# Patient Record
Sex: Female | Born: 1965 | Race: White | Hispanic: No | Marital: Married
Health system: Midwestern US, Academic
[De-identification: ages and names within clinical notes are randomized; demographics above are authoritative.]

---

## 2021-09-19 ENCOUNTER — Encounter: Admit: 2021-09-19 | Discharge: 2021-09-19 | Payer: Private Health Insurance - Indemnity | Primary: Primary Care

## 2021-09-26 ENCOUNTER — Encounter: Admit: 2021-09-26 | Discharge: 2021-09-26 | Payer: Private Health Insurance - Indemnity | Primary: Primary Care

## 2021-09-26 DIAGNOSIS — C50911 Malignant neoplasm of unspecified site of right female breast: Secondary | ICD-10-CM

## 2021-09-26 DIAGNOSIS — R69 Illness, unspecified: Secondary | ICD-10-CM

## 2021-09-26 NOTE — Progress Notes
H&E Slides of [facility] # [id number], Path Date 09/05/2021, requested on 09/26/2021 from [facility].  *.     No shipping label faxed per request of outside facility.  Path slides being sent via courier.

## 2021-09-27 ENCOUNTER — Encounter: Admit: 2021-09-27 | Discharge: 2021-09-27 | Payer: Private Health Insurance - Indemnity | Primary: Primary Care

## 2021-09-27 DIAGNOSIS — C50911 Malignant neoplasm of unspecified site of right female breast: Secondary | ICD-10-CM

## 2021-09-27 DIAGNOSIS — I10 Essential (primary) hypertension: Secondary | ICD-10-CM

## 2021-09-27 DIAGNOSIS — C50511 Malignant neoplasm of lower-outer quadrant of right female breast: Secondary | ICD-10-CM

## 2021-09-27 DIAGNOSIS — C801 Malignant (primary) neoplasm, unspecified: Secondary | ICD-10-CM

## 2021-09-27 LAB — CBC AND DIFF
ABSOLUTE BASO COUNT: 0 K/UL (ref 0–0.20)
ABSOLUTE MONO COUNT: 0.5 K/UL (ref >60)
HEMOGLOBIN: 12 g/dL (ref 12.0–15.0)
LYMPHOCYTES %: 24 % (ref 24–44)
MPV: 9.5 FL — ABNORMAL HIGH (ref 7–11)
RBC COUNT: 4 M/UL (ref 4.0–5.0)
WBC COUNT: 4 K/UL — ABNORMAL LOW (ref 4.5–11.0)

## 2021-09-27 LAB — IRON + BINDING CAPACITY + %SAT+ FERRITIN: IRON: 44 ug/dL — ABNORMAL LOW (ref 50–160)

## 2021-09-27 LAB — VITAMIN B12: VITAMIN B12: 745 pg/mL (ref 180–914)

## 2021-09-27 LAB — COMPREHENSIVE METABOLIC PANEL
ALK PHOSPHATASE: 104 U/L (ref 25–110)
ALT: 22 U/L (ref 7–56)
ANION GAP: 10 K/UL (ref 3–12)
AST: 22 U/L (ref 7–40)
BLD UREA NITROGEN: 11 mg/dL (ref 7–25)
CALCIUM: 9.8 mg/dL (ref 8.5–10.6)
CO2: 26 MMOL/L (ref 21–30)
CREATININE: 0.8 mg/dL (ref 0.4–1.00)
TOTAL BILIRUBIN: 0.8 mg/dL (ref 0.3–1.2)

## 2021-09-27 LAB — FOLATE, SERUM: SERUM FOLATE: >22 ng/mL — ABNORMAL LOW (ref >3.9)

## 2021-09-27 MED ORDER — BENZONATATE 100 MG PO CAP
100 mg | ORAL_CAPSULE | ORAL | 0 refills | 9.00000 days | Status: AC
Start: 2021-09-27 — End: ?

## 2021-09-27 MED ORDER — AZITHROMYCIN 250 MG PO TAB
ORAL_TABLET | Freq: Every day | ORAL | 0 refills | Status: AC
Start: 2021-09-27 — End: ?

## 2021-09-27 NOTE — Progress Notes
Name: Madeline Johnston          MRN: [id number]      DOB: 02-08-1966      AGE: 55 y.o.   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 female.     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    She recently palpated a large right breast lump at the right breast 6 to 7 o'clock position and her primary care provider Terri Piedra, APRN immediately arranged for diagnostic bilateral mammograms and right breast ultrasound.  The patient had not undergone mammographic screening for greater than 10 years.    08/31/2021 diagnostic mammograms and right breast ultrasound at [facility] in [facility] revealed a dominant hyperdense macrolobulated mass at the posterior inferior right breast 6 o'clock position along the chest wall with minimal asymmetry of a right upper outer quadrant intramammary node and abnormal axillary node findings.    09/05/2021 right breast and right axillary biopsies performed at [facility] reveal invasive poorly differentiated ductal carcinoma in the right breast needle biopsy and extensive involvement by poorly differentiated ductal carcinoma in the right axillary lymph node needle biopsy.  Pathology shows that the tumors are grade 3 with ER 0% PR 0%, HER2/neu 0 by IHC with Ki-67 proliferation index of 98%.    09/18/2021 she underwent CT chest abdomen pelvis and bone scan at [facility] with no evidence of metastatic disease.  The CT scan does show the right breast masses present up to 3.6 cm in size with multiple smaller probable satellite nodules and numerous enlarged right axillary nodes and incidental finding of subcentimeter sclerotic focus in the right glenoid likely representing a bone island.  There is no activity on the bone scan and no evidence of metastatic disease.    09/19/2021 bilateral breast MRI performed at [facility] shows the right breast known cancer in the 6 to 7 o'clock position measuring up to 3.7 cm with numerous adjacent satellite masses and extensive non-mass enhancement at the 6 to 7 o'clock position extending toward the nipple with the largest mass inclusive measurement of the non-mass enhancement of 9.2 cm in AP dimension.  The enhancement contacts the chest wall but demonstrates no definitive invasion.  She has atypically enlarged right axillary lymph nodes multiple concerning for metastases.  The left breast has a small amount of benign background enhancement and a T2 hyperintense intramammary lymph node in the anterior aspect of the left breast and left axillary lymph nodes appear minimally enlarged by MRI.  Consider ultrasound of left axilla.    She met Dr. Cyndie Chime in surgery at [facility] 2 weeks ago and did meet with Dr. Donnajean Lopes at  [facility] yesterday who discussed neoadjuvant chemotherapy and immunotherapy.    Past medical history: Hypertension since age 47.  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  Had negative cardiac catheterization.  Appendectomy at age 48.  Hysterectomy 30 years ago due to fibroids.  She did have a COVID infection in July 2022 and has been fatigued since that time.    Gynecology history: Hysterectomy 30 years ago due to fibroids.  Age of first menstrual cycle 10.  Age of first live birth 42.  G3 P3.  She did not breast-feed her children.  She does have retained ovaries.  She has a 8 year old daughter and a 11 year old daughter and a 30 year old son.    Social history: She is accompanied by her husband Michelle Piper.  They have been married for 4 years and live in [place].  She works and an Clinical research associate or optometrist office in [place] but is not currently working because of her breast cancer diagnosis.  She drinks approximately 2 alcoholic beverages per week.    Family history: Mother with renal cell carcinoma at age 17.  Maternal aunt with uterine cancer at age 35.  Maternal grandmother with ovarian cancer at age 21.  She did undergo genetic testing lab draw yesterday at Dr. Zane Herald T's office.       Review of Systems  Mild fatigue since July 2022 but overall doing well.  She does have a new cough in the last week and denies any fever.  The cough is very bothersome.  She will COVID test her self and we are calling in an antibiotic and a cough suppressant.  She notes the right breast mass which developed over the last 2 months.    Constitutional: Negative for fever and chills.   HENT: Negative for ear pain, sore throat, mouth sores, trouble swallowing, neck pain and sinus pressure.    Eyes: Negative for visual disturbance.   Respiratory: Negative for shortness of breath.    Cardiovascular: Negative for chest pain.   Gastrointestinal: Negative for nausea, diarrhea, constipation and blood in stool.   Genitourinary: Negative for dysuria, urgency, frequency, difficulty urinating and dyspareunia.   Musculoskeletal: Negative.    Neurological: Negative for weakness, numbness and headaches.   Hematological: Does not bruise/bleed easily.   Psychiatric/Behavioral: Negative for confusion and decreased concentration. The patient is not nervous/anxious.        Objective:         ? amLODIPine (NORVASC) 10 mg tablet Take 10 mg by mouth.   ? ascorbic acid (VITAMIN C PO) Take  by mouth.   ? aspirin 81 mg cap Take 81 mg by mouth.   ? azithromycin (ZITHROMAX Z-PAK) 250 mg tablet Take two tablets by mouth daily for 1 day, THEN one tablet daily for 4 days.   ? benzonatate (TESSALON PERLES) 100 mg capsule Take one capsule by mouth every 8 hours.   ? carvediloL (COREG) 3.125 mg tablet Take 3.125 mg by mouth.   ? furosemide (LASIX) 20 mg tablet    ? losartan-hydroCHLOROthiazide (HYZAAR) 100-25 mg tablet    ? potassium chloride (K-TAB) 20 mEq tablet   60 EA, TAKE ONE TABLET BY MOUTH TWICE DAILY, 0 Number of Refills     Vitals:    09/27/21 1313   BP: (!) 166/100   BP Source: Arm, Left Upper   Pulse: 77   Temp: 36.7 ?C (98 ?F)   Resp: 20   SpO2: 98%   TempSrc: Oral   PainSc: Zero   Weight:  87.5 kg (193 lb)   Height: 157 cm (5' 1.81)     Body mass index is 35.52 kg/m?Marland Kitchen     Pain Score: Zero       Fatigue Scale: 3    Pain Addressed:  N/A    Patient Evaluated for a Clinical Trial: Patient currently in screening for a treatment clinical trial.     Eastern Cooperative Oncology Group performance status is 0, Fully active, able to carry on all pre-disease performance without restriction.Marland Kitchen     Physical Exam     Very pleasant engaging Caucasian female in no distress.  She does have an occasional cough.  HEENT: No scleral icterus.  She is wearing facemask.  Cardiac: Regular without murmur.  Pulmonary: Clear throughout.  Breasts: Right breast pendulous with very large hard 6 cm mass at the 6 o'clock position.  The lower portion of this is close to the skin and harder with skin thickening.  No true peau d'orange and no erythema or skin color change.  She does have an enlarged 2 cm high right axillary node which is mobile.  Left breast pendulous without mass or adenopathy.  Extremities: No abnormalities.  Neuro: Alert and oriented x3.     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  09/05/2021 right breast and right axillary biopsy reveals poorly differentiated invasive ductal carcinoma which is triple negative with high Ki-67 of 90%.  Further imaging reveals multiple abnormally enlarged right axillary nodes.  09/18/2021 CT chest abdomen pelvis and bone scan show no evidence of metastatic disease.  She understands that these scan results are encouraging but with her high-grade triple negative breast cancer and multiple node positivity, she is at very high risk for micrometastatic disease.  I reviewed all of the outside imaging reports and outside pathology.    I discussed indications for neoadjuvant chemo immunotherapy in patients with triple negative breast cancer.  She will undergo lab work today and is already scheduled for echocardiogram and port placement at [facility] tomorrow.  I discussed neoadjuvant chemo immunotherapy as per the Keynote 522 trial with Taxol carbo pembrolizumab followed by Adriamycin Cytoxan and pembrolizumab to be followed by surgery then adjuvant immunotherapy plus minus additional chemotherapy depending upon response to neoadjuvant treatment.  I am evaluating for our NeoTRACT clinical trial.    She understands that she will require radiotherapy once recovered from surgery due to her node positive disease.    We will arrange for chemotherapy teaching appointment early next week and we will plan to initiate chemo immunotherapy at that time.    I will arrange for [place] breast surgical oncology evaluation soon and will discuss at that time whether or not she would benefit from left axillary ultrasound.    Due to triple negative breast carcinoma at age less than 89 and her positive family history, she did undergo genetic testing at [facility] on 09/26/2021 and we will call for these results when they are available.  This could affect surgical decision and ongoing surveillance following completion of her chemoimmunotherapy.    Approximately 1 hour was spent with the patient and her husband and all their questions were answered to their satisfaction.  Greater than 50% the time was spent in counsel  regarding complex medical issues.  An additional 45 minutes was spent in review of records, documentation, and coordination of care.    This note is partially generated using voice recognition software.  Please excuse any typographical errors.

## 2021-10-02 ENCOUNTER — Encounter: Admit: 2021-10-02 | Discharge: 2021-10-02 | Payer: Private Health Insurance - Indemnity | Primary: Primary Care

## 2021-10-05 ENCOUNTER — Encounter: Admit: 2021-10-05 | Discharge: 2021-10-05 | Payer: Private Health Insurance - Indemnity | Primary: Primary Care

## 2021-10-05 NOTE — Progress Notes
Case Management Progress Note    Name: Madeline Johnston           MRN: [id number]                 DOB: 1966/09/16          Age: 56 y.o.  Date of Service: 10/05/2021          Care Planning   Living Arrangements  Living Arrangements  Type of Residence: Home, independent     Transportation  Transportation  Does the patient use Medicaid Transportation?: No     Coverage   Case Management Additional Coverage  Primary Insurance: Geophysicist/field seismologist Insurance: No insurance  Additional Coverage: RX    Screening/Assessment   Level of Function   Level of Function  Curent Level of Function: Mining engineer Abilities: Alert and Oriented     Source of Income  Source of Income  Source Of Income: Unemployed     DME        Home Health        Hemodialysis or Peritoneal Dialysis        Tube/Enteral        Infusion        Private Duty        HCBS        Ryan White  Ryan White   Ryan White: N/A     Hospice        Outpatient Therapy        SNF/NH        IPR        [place] History: No      Plan  New patient assessment-not an in person visit.    Interventions  Review of patient information, Dx with breast cancer. Patient is married, lives in [place], [place], [place], no known support system issues. Patient is currently not employed. SW provided information via mail (patient should receive soon) on breast cancer support & financial resources, SW role, services & contact information. Will assist as needed.   Support  Support: Patient Education     Info or Referral  Information or Referral to [employer] Resources: Estate manager/land agent Resources     Care Planning        Medication Needs        Financial        Legal       Disposition

## 2021-10-09 ENCOUNTER — Encounter: Admit: 2021-10-09 | Discharge: 2021-10-09 | Payer: Private Health Insurance - Indemnity | Primary: Primary Care

## 2021-10-09 DIAGNOSIS — C50911 Malignant neoplasm of unspecified site of right female breast: Secondary | ICD-10-CM

## 2022-02-15 ENCOUNTER — Encounter: Admit: 2022-02-15 | Discharge: 2022-02-15 | Payer: Private Health Insurance - Indemnity | Primary: Primary Care

## 2022-03-06 ENCOUNTER — Encounter: Admit: 2022-03-06 | Discharge: 2022-03-06 | Payer: Private Health Insurance - Indemnity | Primary: Primary Care

## 2023-01-20 IMAGING — MG POST PROCEDURE MAMMOGRAM
1 series · 3 of 3 positions shown · non-contrast
Comparison: none

[Series 2: R CC · right · 3 of 3 slices shown]
[im 1/3]
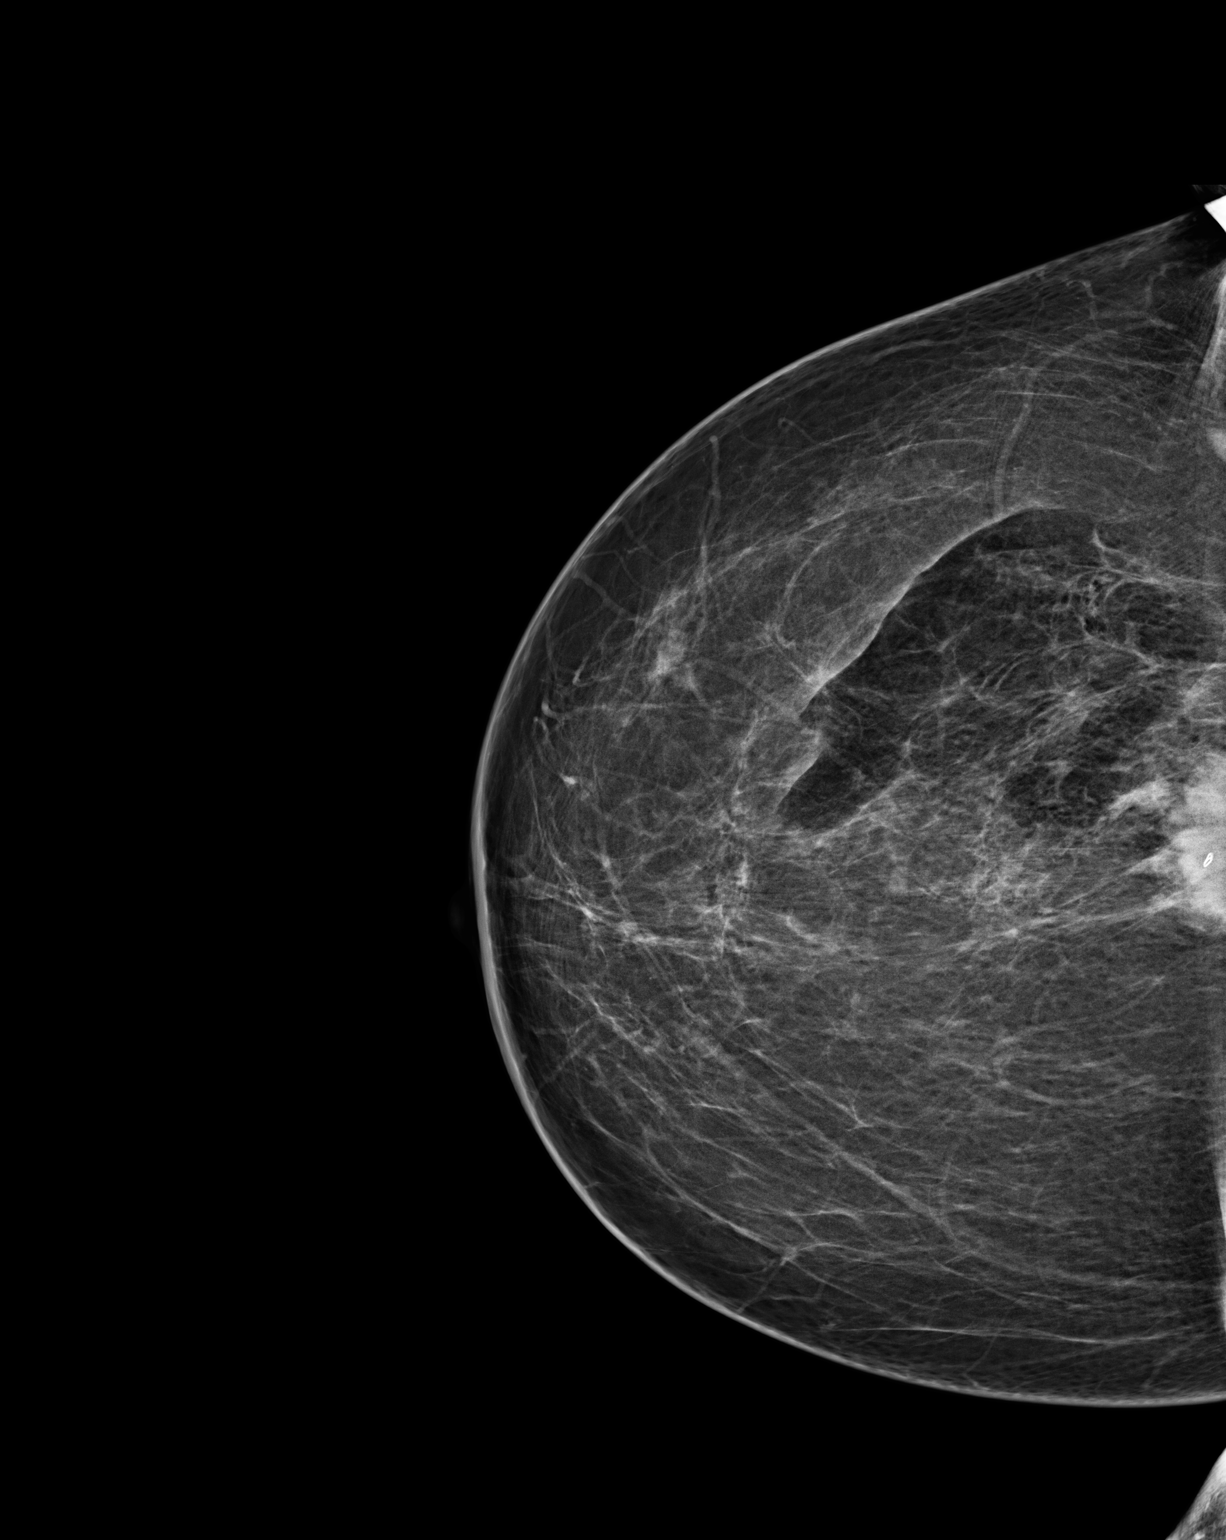
[im 2/3]
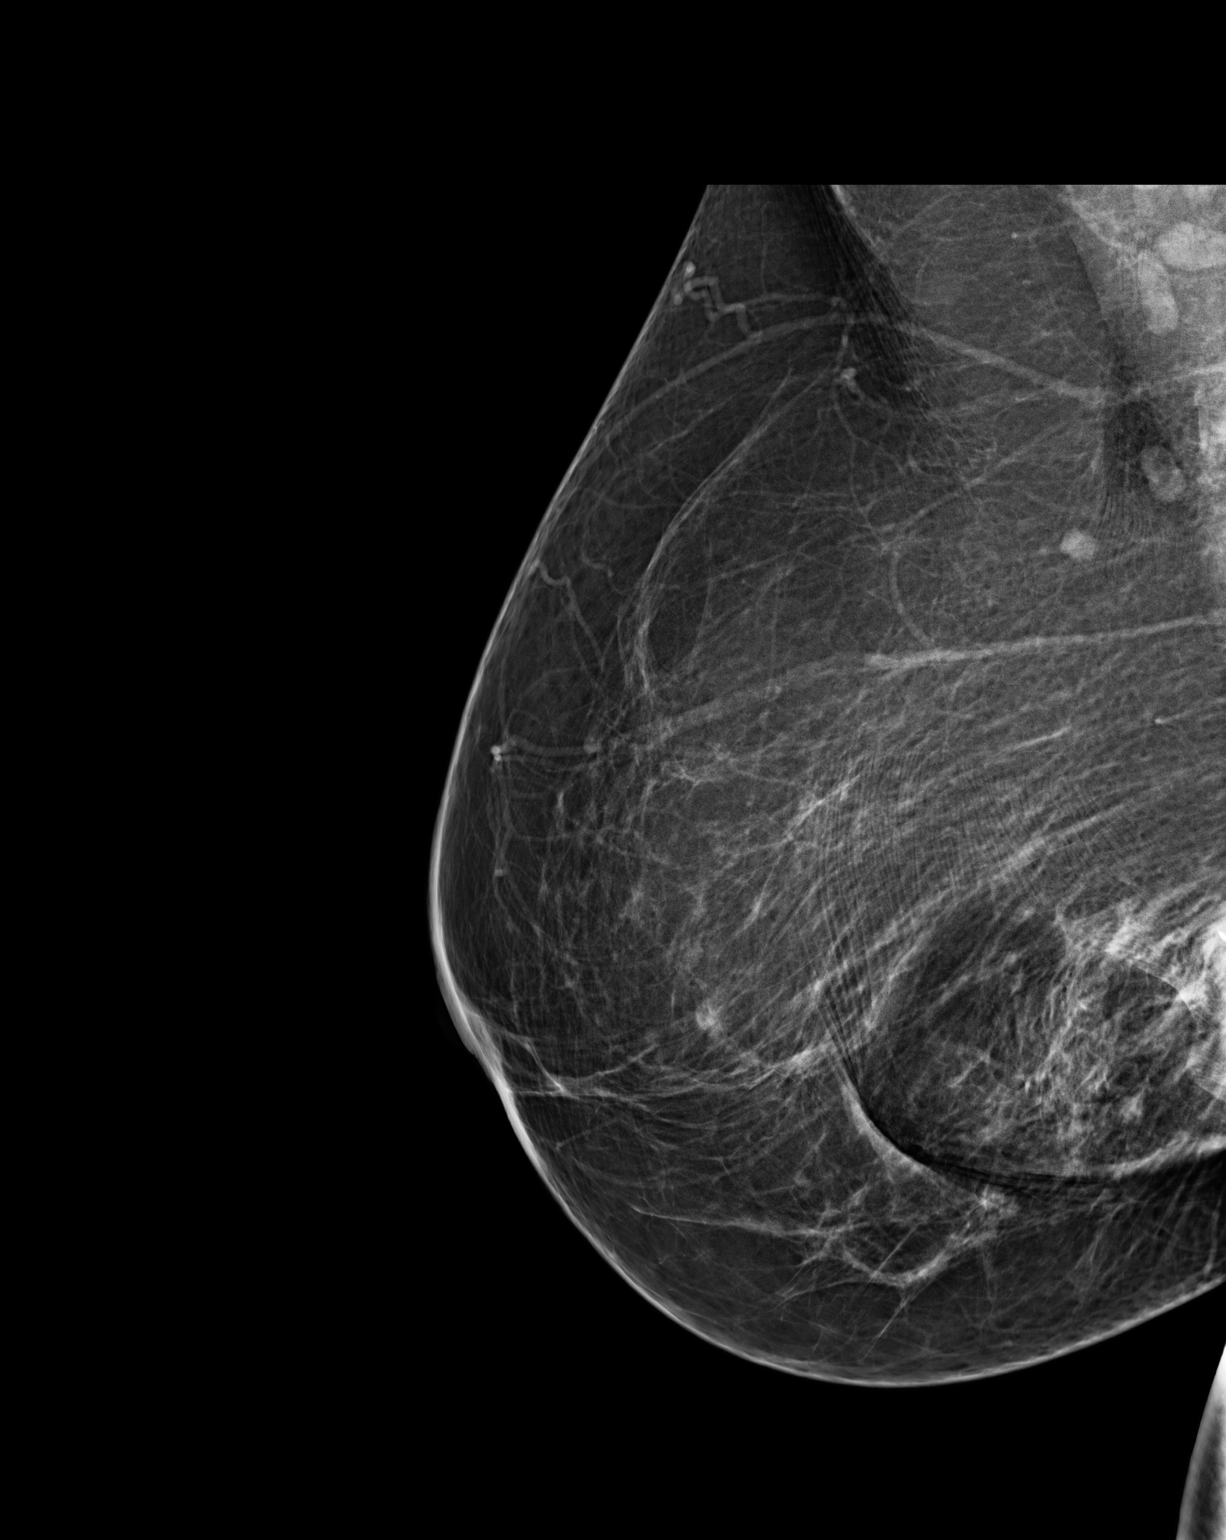
[im 3/3]
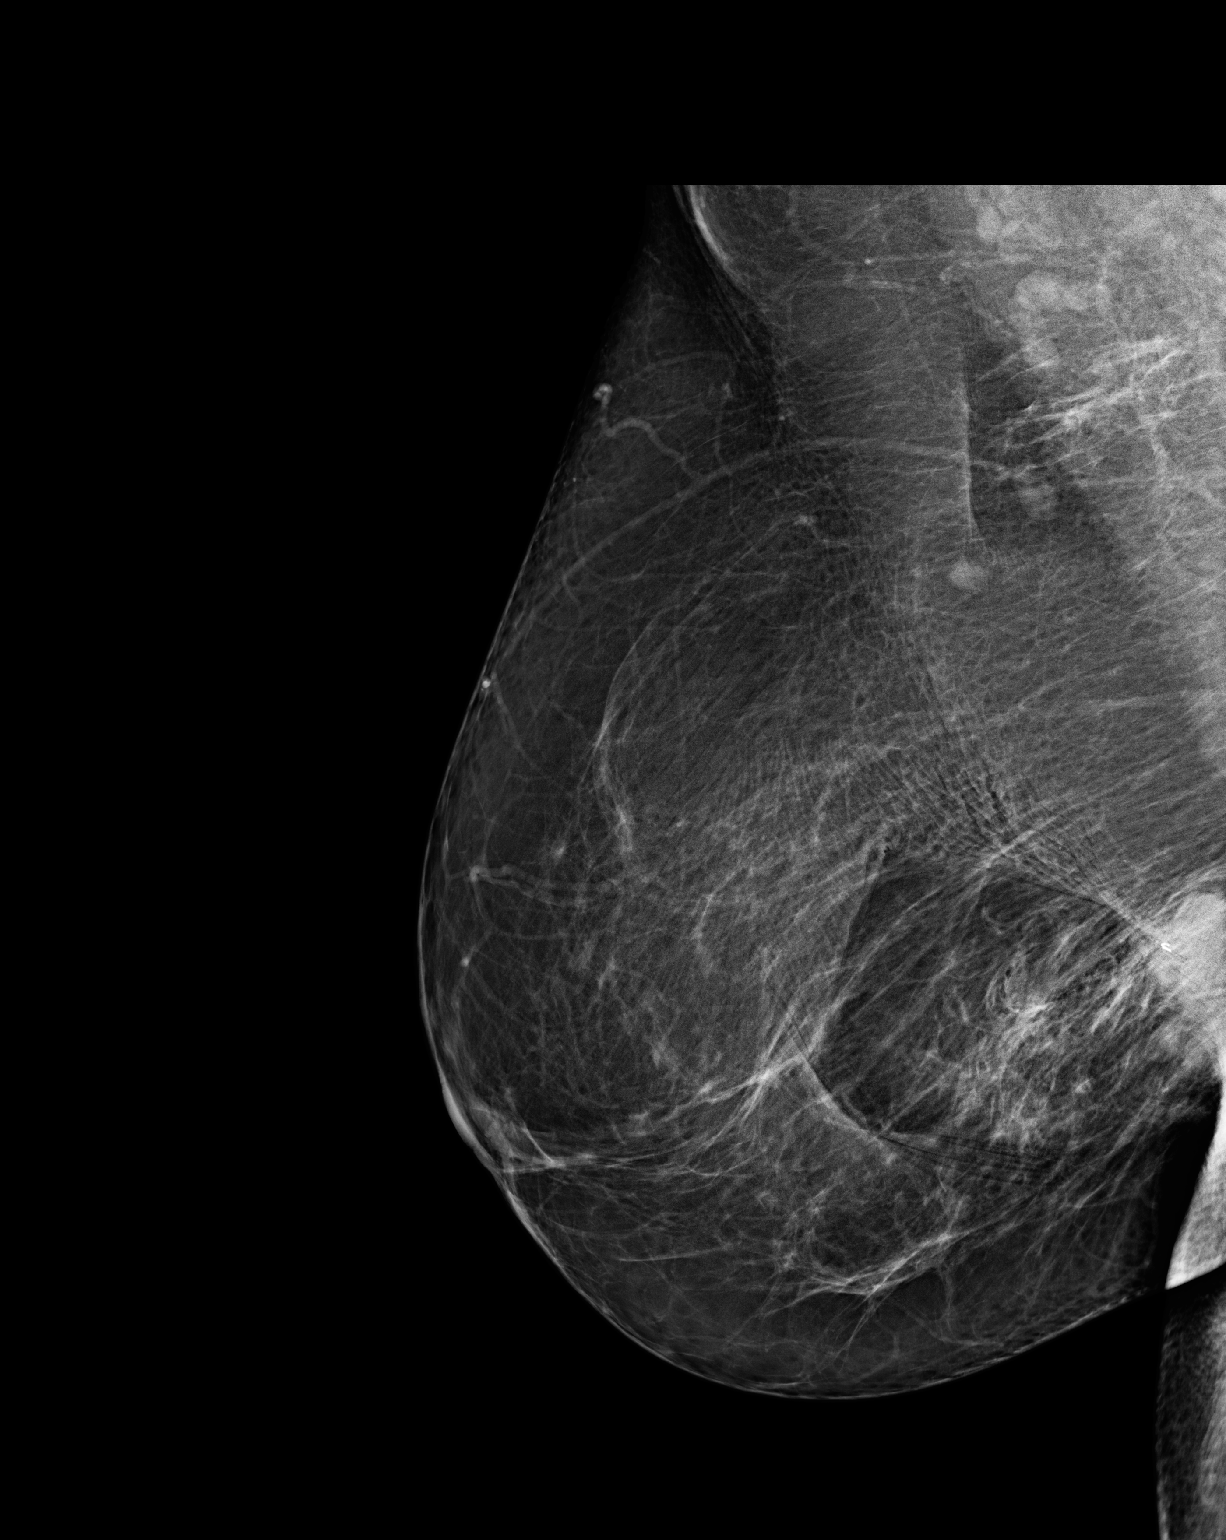

[3 of 3 positions shown; findings below may reference images not displayed]

DIAGNOSTIC STUDIES

EXAM

ULTRASOUND-GUIDED BIOPSY OF THE RIGHT BREAST MASS, ULTRASOUND-GUIDED BIOPSY OF THE RIGHT AXILLARY

INDICATION

abnormal mamo
abnormal mamm and ultrasound

COMPARISONS

08/31/2021

TECHNIQUE AND PROCEDURE

The procedure, its risks and benefits were discussed with the patient. Immediately afterwards, the
patient was placed in the ultrasound stretcher in the supine position and following the selection of
an adequate approach the skin was marked and prepped and draped in the usual sterile fashion.
Subsequently, the soft tissues were infiltrated with 1% Xylocaine and immediately afterwards, a
small incision was made in the skin with a scalpel in the right breast at approximately the 6
o'clock position and utilizing a 15 gauge guide and a 16 gauge biopsy gun multiple samples of the
breast mass were obtained and submitted for permanent section.

Post biopsy clip was deployed.

Attention was then turned to the right axillary lymph node. The soft tissues for an assisted eyes
with 1% lidocaine a small incision in the skin was made with a scalpel. An 18 gauge biopsy gun was
advanced and multiple samples were obtained of the enlarged axillary lymph node. A post biopsy clip
was deployed within the enlarged axillary lymph node.

Post procedural mammogram was obtained and demonstrated accurate clip placement within the breast
mass. The axillary lymph node is too far posterior to visualized within the post biopsy mammogram.

IMPRESSION

Successful ultrasound-guided biopsy of the right-sided breast mass and right axillary lymph node.

Tech Notes:

## 2024-05-06 ENCOUNTER — Encounter: Admit: 2024-05-06 | Discharge: 2024-05-06 | Payer: PRIVATE HEALTH INSURANCE | Primary: Primary Care
